# Patient Record
Sex: Male | Born: 1995 | Hispanic: No | Marital: Single | Smoking: Never smoker
Health system: Southern US, Community
[De-identification: ages and names within clinical notes are randomized; demographics above are authoritative.]

## PROBLEM LIST (undated history)

## (undated) DIAGNOSIS — E119 Type 2 diabetes mellitus without complications: Secondary | ICD-10-CM

## (undated) DIAGNOSIS — J309 Allergic rhinitis, unspecified: Secondary | ICD-10-CM

## (undated) HISTORY — DX: Type 2 diabetes mellitus without complications: E11.9

## (undated) HISTORY — PX: APPENDECTOMY: SHX54

## (undated) HISTORY — DX: Allergic rhinitis, unspecified: J30.9

---
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
## 2014-01-26 ENCOUNTER — Emergency Department (HOSPITAL_COMMUNITY)
Admission: EM | Admit: 2014-01-26 | Discharge: 2014-01-26 | Disposition: A | Discharge status: Home / Self Care | Payer: BC Managed Care – PPO | Attending: Emergency Medicine | Admitting: Emergency Medicine

## 2014-01-26 ENCOUNTER — Emergency Department (HOSPITAL_COMMUNITY): Payer: BC Managed Care – PPO

## 2014-01-26 DIAGNOSIS — M25571 Pain in right ankle and joints of right foot: Secondary | ICD-10-CM

## 2014-01-26 DIAGNOSIS — S8990XA Unspecified injury of unspecified lower leg, initial encounter: Secondary | ICD-10-CM | POA: Insufficient documentation

## 2014-01-26 DIAGNOSIS — Y9239 Other specified sports and athletic area as the place of occurrence of the external cause: Secondary | ICD-10-CM | POA: Insufficient documentation

## 2014-01-26 DIAGNOSIS — X500XXA Overexertion from strenuous movement or load, initial encounter: Secondary | ICD-10-CM | POA: Insufficient documentation

## 2014-01-26 DIAGNOSIS — S99929A Unspecified injury of unspecified foot, initial encounter: Principal | ICD-10-CM

## 2014-01-26 DIAGNOSIS — S99919A Unspecified injury of unspecified ankle, initial encounter: Principal | ICD-10-CM

## 2014-01-26 DIAGNOSIS — Y92838 Other recreation area as the place of occurrence of the external cause: Secondary | ICD-10-CM

## 2014-01-26 DIAGNOSIS — Y9367 Activity, basketball: Secondary | ICD-10-CM | POA: Insufficient documentation

## 2014-01-26 MED ORDER — IBUPROFEN 400 MG PO TABS
800.0000 mg | ORAL_TABLET | Freq: Once | ORAL | Status: AC
  Administered 2014-01-26: 800 mg via ORAL
  Filled 2014-01-26: qty 2

## 2014-01-26 MED ORDER — IBUPROFEN 800 MG PO TABS: 800.0000 mg | ORAL_TABLET | Freq: Three times a day (TID) | ORAL | Status: AC

## 2014-01-26 NOTE — ED Provider Notes (Signed)
CSN: [id number]     Arrival date & time 01/26/14  2029 History  This chart was scribed for non-physician practitioner, Coral CeoJessica Daisa Stennis, PA-C, working with Gavin PoundMichael Y. Oletta LamasGhim, MD by Smiley HousemanFallon Davis, ED Scribe. This patient was seen in room TR05C/TR05C and the patient's care was started at 10:18 PM.  Chief Complaint  Patient presents with  . Ankle Pain   The history is provided by the patient. No language interpreter was used.   HPI Comments: Ryan Waller is a 18 y.o. male who presents to the Emergency Department complaining of constant worsening right ankle pain that started about 1 hour ago. Pt states he twisted his ankle playing basketball. Pt states when he landed he rolled his foot on the lateral aspect. Pt denies pain radiating into his foot. Movement and ambulation makes his pain worse. No numbness, tingling, loss of sensation or weakness. Pt denies taking anything for pain PTA. Pt denies any other injuries. He states he is otherwise healthy. No previous ankle injuries in the past.    No past medical history on file. Past Surgical History  Procedure Laterality Date  . Laparoscopic appendectomy  11/18/2011    Procedure: APPENDECTOMY LAPAROSCOPIC;  Surgeon: Judie PetitM. Leonia CoronaShuaib Farooqui, MD;  Location: [facility] OR;  Service: Pediatrics;  Laterality: Bilateral;  Ruptured Appendix   Family History  Problem Relation Age of Onset  . Hypertension Maternal Grandfather   . Diabetes Maternal Grandfather   . Hypertension Paternal Grandfather    History  Substance Use Topics  . Smoking status: Never Smoker   . Smokeless tobacco: Not on file  . Alcohol Use: No    Review of Systems  Constitutional: Negative for fever and chills.  Gastrointestinal: Negative for nausea, vomiting, abdominal pain and diarrhea.  Musculoskeletal: Positive for arthralgias (Right ankle) and joint swelling. Negative for neck pain and neck stiffness.  Skin: Negative for color change and rash.  Neurological: Negative for weakness, numbness  and headaches.  Psychiatric/Behavioral: Negative for behavioral problems and confusion.  All other systems reviewed and are negative.   Allergies  Review of patient's allergies indicates no known allergies.  Home Medications   Prior to Admission medications   Not on File   Triage Vitals: BP 149/86  Pulse 116  Temp(Src) 97 F (36.1 C) (Oral)  Resp 20  Ht 5\' 10"  (1.778 m)  Wt 225 lb (102.059 kg)  BMI 32.28 kg/m2  SpO2 99%  Filed Vitals:   01/26/14 2033 01/26/14 2226  BP: 149/86 145/73  Pulse: 116 63  Temp: 97 F (36.1 C)   TempSrc: Oral   Resp: 20 16  Height: 5\' 10"  (1.778 m)   Weight: 225 lb (102.059 kg)   SpO2: 99% 98%    Physical Exam  Nursing note and vitals reviewed. Constitutional: He is oriented to person, place, and time. He appears well-developed and well-nourished. No distress.  HENT:  Head: Normocephalic and atraumatic.  Right Ear: External ear normal.  Left Ear: External ear normal.  Mouth/Throat: Oropharynx is clear and moist.  Eyes: Conjunctivae are normal. Right eye exhibits no discharge. Left eye exhibits no discharge.  Neck: Neck supple.  Cardiovascular: Normal rate.   Dorsalis pedis pulses present and equal bilaterally  Pulmonary/Chest: Effort normal.  Abdominal: Soft.  Musculoskeletal: Normal range of motion. He exhibits edema and tenderness.       Feet:  Tenderness to palpation to the right lateral ankle with associated mild edema. No ecchymosis, erythema or wounds. No tenderness to the right foot, calf, or  knee throughout. ROM intact in the right foot, ankle and knee.   Neurological: He is alert and oriented to person, place, and time.  Sensation intact in the right foot  Skin: Skin is warm and dry. He is not diaphoretic.     ED Course  Procedures (including critical care time) DIAGNOSTIC STUDIES: Oxygen Saturation is 99% on RA, normal by my interpretation.    COORDINATION OF CARE: 10:21 PM-Informed pt his x-ray showed normal  results.  Will order splint and crutches.  Will order ibuprofen.  Patient informed of current plan of treatment and evaluation and agrees with plan.    Imaging Review Dg Ankle Complete Right  01/26/2014   CLINICAL DATA:  Pain post trauma  EXAM: RIGHT ANKLE - COMPLETE 3+ VIEW  COMPARISON:  None.  FINDINGS: Frontal, oblique, and lateral views were obtained. There is soft tissue swelling laterally. No fracture or effusion. Ankle mortise appears intact.  IMPRESSION: Soft tissue swelling laterally.  No fracture.  Mortise intact.   Electronically Signed   By: Bretta BangWilliam  Woodruff M.D.   On: 01/26/2014 21:33   MDM   Ryan Waller is a 18 y.o. male who presents to the Emergency Department complaining of constant worsening right ankle pain that started about 1 hour ago after injury. Etiology of ankle pain possibly due to an ankle sprain. X-rays negative for fracture or malalignment. Patient neurovascularly intact. Patient given ankle splint and crutches. Given orthopedic referral. RICE method discussed. Return precautions, discharge instructions, and follow-up was discussed with the patient before discharge.     Discharge Medication List as of 01/26/2014 10:28 PM    START taking these medications   Details  ibuprofen (ADVIL,MOTRIN) 800 MG tablet Take 1 tablet (800 mg total) by mouth 3 (three) times daily., Starting 01/26/2014, Until Discontinued, Print        Final impressions: 1. Ankle pain, right      Greer EeJessica Katlin Laurine Kuyper PA-C           Jillyn LedgerJessica K Amarachukwu Lakatos, PA-C 01/28/14 [phone]

## 2014-01-26 NOTE — ED Notes (Signed)
Patient transported to X-ray 

## 2014-01-26 NOTE — Discharge Instructions (Signed)
Take Ibuprofen 600-800 mg every 6-8 hours for pain and swelling  Keep leg elevated  Use crutches and ankle splint  Use RICE method - see below  Return to the emergency department if you develop any changing/worsening condition or any other concerns (please read additional information regarding your condition below)    Ankle Pain Ankle pain is a common symptom. The bones, cartilage, tendons, and muscles of the ankle joint perform a lot of work each day. The ankle joint holds your body weight and allows you to move around. Ankle pain can occur on either side or back of 1 or both ankles. Ankle pain may be sharp and burning or dull and aching. There may be tenderness, stiffness, redness, or warmth around the ankle. The pain occurs more often when a person walks or puts pressure on the ankle. CAUSES  There are many reasons ankle pain can develop. It is important to work with your caregiver to identify the cause since many conditions can impact the bones, cartilage, muscles, and tendons. Causes for ankle pain include:  Injury, including a break (fracture), sprain, or strain often due to a fall, sports, or a high-impact activity.  Swelling (inflammation) of a tendon (tendonitis).  Achilles tendon rupture.  Ankle instability after repeated sprains and strains.  Poor foot alignment.  Pressure on a nerve (tarsal tunnel syndrome).  Arthritis in the ankle or the lining of the ankle.  Crystal formation in the ankle (gout or pseudogout). DIAGNOSIS  A diagnosis is based on your medical history, your symptoms, results of your physical exam, and results of diagnostic tests. Diagnostic tests may include X-ray exams or a computerized magnetic scan (magnetic resonance imaging, MRI). TREATMENT  Treatment will depend on the cause of your ankle pain and may include:  Keeping pressure off the ankle and limiting activities.  Using crutches or other walking support (a cane or brace).  Using rest, ice,  compression, and elevation.  Participating in physical therapy or home exercises.  Wearing shoe inserts or special shoes.  Losing weight.  Taking medications to reduce pain or swelling or receiving an injection.  Undergoing surgery. HOME CARE INSTRUCTIONS   Only take over-the-counter or prescription medicines for pain, discomfort, or fever as directed by your caregiver.  Put ice on the injured area.  Put ice in a plastic bag.  Place a towel between your skin and the bag.  Leave the ice on for 15-20 minutes at a time, 03-04 times a day.  Keep your leg raised (elevated) when possible to lessen swelling.  Avoid activities that cause ankle pain.  Follow specific exercises as directed by your caregiver.  Record how often you have ankle pain, the location of the pain, and what it feels like. This information may be helpful to you and your caregiver.  Ask your caregiver about returning to work or sports and whether you should drive.  Follow up with your caregiver for further examination, therapy, or testing as directed. SEEK MEDICAL CARE IF:   Pain or swelling continues or worsens beyond 1 week.  You have an oral temperature above 102 F (38.9 C).  You are feeling unwell or have chills.  You are having an increasingly difficult time with walking.  You have loss of sensation or other new symptoms.  You have questions or concerns. MAKE SURE YOU:   Understand these instructions.  Will watch your condition.  Will get help right away if you are not doing well or get worse. Document Released: 03/14/2010  Document Revised: 12/17/2011 Document Reviewed: 03/14/2010 [facility] Patient Information 2014 [place], [place].  RICE: Routine Care for Injuries The routine care of many injuries includes Rest, Ice, Compression, and Elevation (RICE). HOME CARE INSTRUCTIONS  Rest is needed to allow your body to heal. Routine activities can usually be resumed when comfortable. Injured  tendons and bones can take up to 6 weeks to heal. Tendons are the cord-like structures that attach muscle to bone.  Ice following an injury helps keep the swelling down and reduces pain.  Put ice in a plastic bag.  Place a towel between your skin and the bag.  Leave the ice on for 15-20 minutes, 03-04 times a day. Do this while awake, for the first 24 to 48 hours. After that, continue as directed by your caregiver.  Compression helps keep swelling down. It also gives support and helps with discomfort. If an elastic bandage has been applied, it should be removed and reapplied every 3 to 4 hours. It should not be applied tightly, but firmly enough to keep swelling down. Watch fingers or toes for swelling, bluish discoloration, coldness, numbness, or excessive pain. If any of these problems occur, remove the bandage and reapply loosely. Contact your caregiver if these problems continue.  Elevation helps reduce swelling and decreases pain. With extremities, such as the arms, hands, legs, and feet, the injured area should be placed near or above the level of the heart, if possible. SEEK IMMEDIATE MEDICAL CARE IF:  You have persistent pain and swelling.  You develop redness, numbness, or unexpected weakness.  Your symptoms are getting worse rather than improving after several days. These symptoms may indicate that further evaluation or further X-rays are needed. Sometimes, X-rays may not show a small broken bone (fracture) until 1 week or 10 days later. Make a follow-up appointment with your caregiver. Ask when your X-ray results will be ready. Make sure you get your X-ray results. Document Released: 01/06/2001 Document Revised: 12/17/2011 Document Reviewed: 02/23/2011 [facility] Patient Information 2014 [place]Care, [place]LL[place].   Emergency Department Resource Guide 1) Find a Doctor and Pay Out of Pocket Although you won't have to find out who is covered by your insurance plan, it is a good idea to ask  around and get recommendations. You will then need to call the office and see if the doctor you have chosen will accept you as a new patient and what types of options they offer for patients who are self-pay. Some doctors offer discounts or will set up payment plans for their patients who do not have insurance, but you will need to ask so you aren't surprised when you get to your appointment.  2) Contact Your Local Health Department Not all health departments have doctors that can see patients for sick visits, but many do, so it is worth a call to see if yours does. If you don't know where your local health department is, you can check in your phone book. The CDC also has a tool to help you locate your state's health department, and many state websites also have listings of all of their local health departments.  3) Find a Walk-in Clinic If your illness is not likely to be very severe or complicated, you may want to try a walk in clinic. These are popping up all over the country in pharmacies, drugstores, and shopping centers. They're usually staffed by nurse practitioners or physician assistants that have been trained to treat common illnesses and complaints. They're usually fairly quick and inexpensive.  However, if you have serious medical issues or chronic medical problems, these are probably not your best option.  No Primary Care Doctor: - Call Health Connect at  [phone] - they can help you locate a primary care doctor that  accepts your insurance, provides certain services, etc. - Physician Referral Service- [phone]  Chronic Pain Problems: Organization         Address  Phone   Notes  [facility]  [phone] Patients need to be referred by their primary care doctor.   Medication Assistance: Organization         Address  Phone   Notes  [facility] Medication [facility] [address] [place], [place] [address] [phone] --Must be a  resident of [place] [facility] -- Must have NO insurance coverage whatsoever (no Medicaid/ Medicare, etc.) -- The pt. MUST have a primary care doctor that directs their care regularly and follows them in the community   MedAssist  [phone]   [employer]  [phone]    Agencies that provide inexpensive medical care: Organization         Address  Phone   Notes  [facility]  [phone]   [facility]    [phone]   [facility] [address] [place], [place] [address] [phone]) [phone]   [facility] [address], [place] [phone]) [phone]   Planned Parenthood    [phone]   [facility]    [phone]) [phone]   [facility] and [facility]  [address], [place] Phone:  [phone], Fax:  [phone] Hours of Operation:  9 am - 6 pm, M-F.  Also accepts Medicaid/Medicare and self-pay.  [facility]  [address], [place] Phone: [phone], Fax: [phone]. Hours of Operation:  8:30 am - 5:30 pm, M-F.  Also accepts Medicaid and self-pay.  [facility] [place] [address], [place] Phone: [phone]   [facility] [address], [place], [place] [phone], Ext. [phone] Mondays & Thursdays: 7-9 AM.  First 15 patients are seen on a first come, first serve basis.    Medicaid-accepting [facility] Providers:  Organization         Address  Phone   Notes  [facility] [address], [place] [phone] Also accepts self-pay patients.  [facility] [address], [place]  [phone]   [facility] [address], [place] [phone]   [facility] [address] [place] [address], [place] [phone]) [phone]   Renaye RakersVeita Bland [address], [place]   [phone] Only accepts  [place] Access [place] patients after they have their name applied to their card.   Self-Pay (no insurance) in [facility] [place]:  Organization         Address  Phone   Notes  Sickle Cell Patients, [facility] [address], [place] [phone]   [facility] [address], [place] [phone]   [facility] [place]  [address], [place] [phone]) [phone]   [facility]/Dr. Osei-Bonsu  [address] [place] [address], [place] or [id number], [place] [phone] Phone number for both [place] and [place] locations is the same.  [facility] [address], [place] [phone]   [facility] [address], [place] or [address] [phone]   [facility] [address], [place] [phone], phone; [phone], fax Sees patients 1st and 3rd Saturday of every month.  Must not qualify for public or private insurance (i.e. Medicaid, Medicare, [place] Health Choice, Veterans' Benefits)  Household income should be no more than 200% of the poverty level The clinic cannot treat you if you are pregnant or think you are pregnant  Sexually transmitted diseases are not treated at the clinic.    Dental Care: Organization         Address  Phone  Notes  [facility] [address] [place], [place] [phone] Accepts children up to age 88 who are enrolled in [place] or [place] Health Choice; pregnant women with a Medicaid card; and children who have applied for Medicaid or [place] Health Choice, but were declined, whose parents can pay a reduced fee at time of service.  [facility] of [facility]  [address], [place] [phone] Accepts children up to age 61 who are enrolled in [place] or [place] Health Choice; pregnant women  with a Medicaid card; and children who have applied for Medicaid or [place] Health Choice, but were declined, whose parents can pay a reduced fee at time of service.  [facility]  [address] [place], [place] [phone] Patients are seen by appointment only. Walk-ins are not accepted. [facility] will see patients 87 years of age and older. Monday - Tuesday (8am-5pm) Most Wednesdays (8:30-5pm) $30 per visit, cash only  [facility] Adult Dental Access PROGRAM  [address], [facility] [phone] Patients are seen by appointment only. Walk-ins are not accepted. [facility] will see patients 34 years of age and older. One Wednesday Evening (Monthly: Volunteer Based).  $30 per visit, cash only  [employer]  [phone] for adults; Children under age 98, call Graduate Pediatric Dentistry at [phone]. Children aged 17-14, please call [phone] to request a pediatric application.  Dental services are provided in all areas of dental care including fillings, crowns and bridges, complete and partial dentures, implants, gum treatment, root canals, and extractions. Preventive care is also provided. Treatment is provided to both adults and children. Patients are selected via a lottery and there is often a waiting list.   [facility] [address] [place]  [phone] [email or web address]   [facility] [address] [place], [place] [phone], Ext. [phone] Second and Fourth Thursday of each month, opens at 6:30 AM; Clinic ends at 9 AM.  Patients are seen on a first-come first-served basis, and a limited number are seen during each clinic.   [facility]  [address] [place], [place] [phone]   Eligibility Requirements You must have lived in [place], [place], or [place] counties for at least the last three months.   You cannot be eligible for state or federal sponsored [employer], including [employer], [place], or [employer].   You generally cannot be eligible for healthcare insurance through your employer.    How to apply: Eligibility screenings are held every Tuesday and Wednesday afternoon from 1:00 pm until 4:00 pm. You  do not need an appointment for the interview!  [facility] [address] [place], [place] [phone]   [facility] Health Department  [phone]   [facility] Health Department  [phone]   [facility] Health Department  [phone]    Behavioral Health Resources in the Community: Intensive Outpatient Programs Organization         Address  Phone  Notes  [facility] [address], [place], [place] [phone]   [facility]patient [address], [place], [place] [phone]   [facility] [address] [place], [place]  [phone]   [facility] [address]  [place], [place] [phone] or [phone]   Substance Abuse Resources Organization         Address  Phone  Notes  Alcohol [facility] Drug Services  [phone]   [facility]  [phone]   [facility] [place]  [phone]   Floydene Flock  [phone]   Residential & Outpatient Substance Abuse Program  [phone]   Psychological Services Organization         Address  Phone  Notes  [facility]  [phone]   [facility] Services  [phone]   [facility] [address] [place] [phone] or [phone]    Mobile Crisis Teams Organization         Address  Phone  Notes  [facility], Mobile Crisis Care Unit  [phone]   [facility]  [address] [place], [place] [phone]   Doristine Locks [address] [place] [phone]    Self-Help/Support Groups Organization         Address  Phone             Notes  Mental  Health Assoc. of [place] - variety of support groups  [phone]- I7437963 Call for more information  Narcotics Anonymous (NA), [facility] [address], [employer] [place]  2 meetings at this location   Statistician         Address  Phone  Notes  [facility] [address],    [place]  [phone]   [facility]  [address] [place] [address], [place], [place] [phone]   [facility] [address] [place], [place] [phone] Admissions: 8am-3pm M-F  [facility] [address]    [place], [place] [phone]   [facility] [address] [place], [place], [place] [phone]   [facility] [address],  [place], [place] [phone]   [facility] - Intensive Outpatient [address], [place], [place] [phone]   [facility] (Addiction Recovery [facility] [address]  [place], [place] [phone] or [phone]   [facility] ([facility]) [address], [place], [place] [phone] Accepts Medicaid  [facility] [address],  [place] [phone] Substance Abuse/Addiction Treatment   [facility] Organization         Address  Phone  Notes  [facility]  [phone]   Angie Fava, PhD [address] [place], [place]   [phone] or [phone]   [facility] Behavioral   [address] [place], [place] [phone]   [facility] [address] [place], [place] [phone] Insurance/Medicaid/sponsorship through [employer] and Families [address]  [place], [place] [phone] [place], [place] [phone]    Dr. Adele Schilder  [phone]   [facility] Dept. 1) [address]  [place] 2) [place] 3)  [address], [place] [phone]  [phone]   [place] [phone] or [phone] (After Hours)

## 2014-01-26 NOTE — ED Notes (Signed)
Pt c/o right ankle pain. Pt states he twisted it 45 minutes ago. Pt A&Ox4, respirations equal and unlabored, skin warm and dry

## 2014-01-26 NOTE — ED Notes (Signed)
Onset 7:30pm pt twisted right ankle playing basketball.  Unable to bear weight on ankle.

## 2014-01-26 NOTE — ED Notes (Signed)
Pt declines ice pack at this time.

## 2014-01-30 NOTE — ED Provider Notes (Signed)
Medical screening examination/treatment/procedure(s) were performed by non-physician practitioner and as supervising physician I was immediately available for consultation/collaboration.  Ryan PoundMichael Y. Zaide Kardell, MD 01/30/14 1417

## 2015-11-17 ENCOUNTER — Emergency Department (HOSPITAL_BASED_OUTPATIENT_CLINIC_OR_DEPARTMENT_OTHER): Payer: Medicaid Other

## 2015-11-17 ENCOUNTER — Emergency Department (HOSPITAL_BASED_OUTPATIENT_CLINIC_OR_DEPARTMENT_OTHER)
Admission: EM | Admit: 2015-11-17 | Discharge: 2015-11-17 | Disposition: A | Discharge status: Home / Self Care | Payer: Medicaid Other | Attending: Emergency Medicine | Admitting: Emergency Medicine

## 2015-11-17 ENCOUNTER — Encounter (HOSPITAL_BASED_OUTPATIENT_CLINIC_OR_DEPARTMENT_OTHER): Payer: Self-pay | Admitting: *Deleted

## 2015-11-17 DIAGNOSIS — Z23 Encounter for immunization: Secondary | ICD-10-CM | POA: Diagnosis not present

## 2015-11-17 DIAGNOSIS — Z792 Long term (current) use of antibiotics: Secondary | ICD-10-CM | POA: Insufficient documentation

## 2015-11-17 DIAGNOSIS — Y9289 Other specified places as the place of occurrence of the external cause: Secondary | ICD-10-CM | POA: Insufficient documentation

## 2015-11-17 DIAGNOSIS — Y9389 Activity, other specified: Secondary | ICD-10-CM | POA: Insufficient documentation

## 2015-11-17 DIAGNOSIS — W25XXXA Contact with sharp glass, initial encounter: Secondary | ICD-10-CM | POA: Diagnosis not present

## 2015-11-17 DIAGNOSIS — Z791 Long term (current) use of non-steroidal anti-inflammatories (NSAID): Secondary | ICD-10-CM | POA: Diagnosis not present

## 2015-11-17 DIAGNOSIS — Y998 Other external cause status: Secondary | ICD-10-CM | POA: Insufficient documentation

## 2015-11-17 DIAGNOSIS — IMO0002 Reserved for concepts with insufficient information to code with codable children: Secondary | ICD-10-CM

## 2015-11-17 DIAGNOSIS — S61210A Laceration without foreign body of right index finger without damage to nail, initial encounter: Secondary | ICD-10-CM | POA: Insufficient documentation

## 2015-11-17 MED ORDER — MUPIROCIN 2 % EX OINT: TOPICAL_OINTMENT | CUTANEOUS | Status: DC

## 2015-11-17 MED ORDER — TETANUS-DIPHTH-ACELL PERTUSSIS 5-2.5-18.5 LF-MCG/0.5 IM SUSP
0.5000 mL | Freq: Once | INTRAMUSCULAR | Status: AC
  Administered 2015-11-17: 0.5 mL via INTRAMUSCULAR
  Filled 2015-11-17: qty 0.5

## 2015-11-17 NOTE — ED Notes (Signed)
Yesterday he cut his right index finger on a broken bottle.

## 2015-11-17 NOTE — ED Notes (Signed)
Pt verbalizes understanding of d/c instructions and denies any further needs at this time. 

## 2015-11-17 NOTE — Discharge Instructions (Signed)
Apply antibiotic ointment to affected area twice daily. Keep area clean and dry.  Please return to the ER for new or worsening symptoms, any additional concerns.

## 2015-11-18 NOTE — ED Provider Notes (Signed)
CSN: [id number]     Arrival date & time 11/17/15  2152 History   First MD Initiated Contact with Patient 11/17/15 2211     Chief Complaint  Patient presents with  . Laceration     (Consider location/radiation/quality/duration/timing/severity/associated sxs/prior Treatment) Patient is a 20 y.o. male presenting with skin laceration. The history is provided by medical records. No language interpreter was used.  Laceration  Ryan Waller presents for cut on right index finger that occurred yesterday while he was lifting a piece of glass. Patient states no glass was broken off of main piece and does not believe fragments are inside wound. No medications taken PTA. Denies erythema, numbness/tingling of digit. No radiation of pain. Last tetanus unknown.   History reviewed. No pertinent past medical history. Past Surgical History  Procedure Laterality Date  . Laparoscopic appendectomy  11/18/2011    Procedure: APPENDECTOMY LAPAROSCOPIC;  Surgeon: Judie Petit. Leonia Corona, MD;  Location: [facility] OR;  Service: Pediatrics;  Laterality: Bilateral;  Ruptured Appendix  . Appendectomy     Family History  Problem Relation Age of Onset  . Hypertension Maternal Grandfather   . Diabetes Maternal Grandfather   . Hypertension Paternal Grandfather    Social History  Substance Use Topics  . Smoking status: Never Smoker   . Smokeless tobacco: None  . Alcohol Use: No    Review of Systems  Constitutional: Negative for fever and chills.  HENT: Negative for congestion and sore throat.   Eyes: Negative for visual disturbance.  Respiratory: Negative for cough and shortness of breath.   Cardiovascular: Negative.   Gastrointestinal: Negative for nausea, vomiting and abdominal pain.  Genitourinary: Negative for dysuria.  Musculoskeletal: Negative for back pain and neck pain.  Skin: Positive for wound.  Neurological: Negative for dizziness, weakness and headaches.      Allergies  Review of patient's allergies  indicates no known allergies.  Home Medications   Prior to Admission medications   Medication Sig Start Date End Date Taking? Authorizing Provider  ibuprofen (ADVIL,MOTRIN) 800 MG tablet Take 1 tablet (800 mg total) by mouth 3 (three) times daily. 01/26/14   Jillyn Ledger, PA-C  mupirocin ointment (BACTROBAN) 2 % Apply to affected area twice daily. 11/17/15   Caylynn Minchew Pilcher Evin Chirco, PA-C   BP 131/83 mmHg  Pulse 63  Temp(Src) 97.8 F (36.6 C) (Oral)  Resp 18  Ht  (1.803 m)  Wt 99.791 kg  BMI 30.70 kg/m2  SpO2 98% Physical Exam  Constitutional: He is oriented to person, place, and time. He appears well-developed and well-nourished.  Alert and in no acute distress  HENT:  Head: Normocephalic and atraumatic.  Cardiovascular: Normal rate, regular rhythm, normal heart sounds and intact distal pulses.  Exam reveals no gallop and no friction rub.   No murmur heard. Pulmonary/Chest: Effort normal and breath sounds normal. No respiratory distress. He has no wheezes. He has no rales. He exhibits no tenderness.  Abdominal: Soft. Bowel sounds are normal. He exhibits no distension and no mass. There is no tenderness. There is no rebound and no guarding.  Musculoskeletal: He exhibits no edema.  No gross deformity noted. Patient has full active and passive range of motion without pain. The patient has normal sensation and motor function in the median, ulnar, and radial nerve distributions. 2+ Radial pulse. 5/5 muscle strength.   Neurological: He is alert and oriented to person, place, and time.  Skin: Skin is warm and dry.  1 cm laceration to distal right index  finger. No surrounding erythema.   Psychiatric: He has a normal mood and affect. His behavior is normal. Judgment and thought content normal.  Nursing note and vitals reviewed.   ED Course  Procedures (including critical care time) Labs Review Labs Reviewed - No data to display  Imaging Review Dg Finger Index Right  11/17/2015   CLINICAL DATA:  Laceration with continued bleeding.  Pain. EXAM: RIGHT INDEX FINGER 2+V COMPARISON:  None. FINDINGS: There is no evidence of fracture or dislocation. There is no evidence of arthropathy or other focal bone abnormality. Soft tissues are unremarkable. IMPRESSION: Negative. Electronically Signed   By: Elsie Stain M.D.   On: 11/17/2015 22:15   I have personally reviewed and evaluated these images and lab results as part of my medical decision-making.   EKG Interpretation None      MDM   Final diagnoses:  Laceration   Ryan Waller presents for small laceration of right index finger not requiring suturing. X-ray negative. Wound was thoroughly cleansed and explored in ED. Tetanus updated. Mupirocin ointment given. Home care instructions, follow up instructions given - all questions answered.   [facility] Detravion Tester, PA-C 11/18/15 0118  Paula Libra, MD 11/18/15 [phone]

## 2016-12-23 ENCOUNTER — Encounter (HOSPITAL_BASED_OUTPATIENT_CLINIC_OR_DEPARTMENT_OTHER): Payer: Self-pay

## 2016-12-23 ENCOUNTER — Emergency Department (HOSPITAL_BASED_OUTPATIENT_CLINIC_OR_DEPARTMENT_OTHER)
Admission: EM | Admit: 2016-12-23 | Discharge: 2016-12-23 | Disposition: A | Discharge status: Home / Self Care | Payer: BLUE CROSS/BLUE SHIELD | Attending: Emergency Medicine | Admitting: Emergency Medicine

## 2016-12-23 DIAGNOSIS — S51851A Open bite of right forearm, initial encounter: Secondary | ICD-10-CM | POA: Diagnosis present

## 2016-12-23 DIAGNOSIS — S51811A Laceration without foreign body of right forearm, initial encounter: Secondary | ICD-10-CM | POA: Insufficient documentation

## 2016-12-23 DIAGNOSIS — Y939 Activity, unspecified: Secondary | ICD-10-CM | POA: Insufficient documentation

## 2016-12-23 DIAGNOSIS — Y929 Unspecified place or not applicable: Secondary | ICD-10-CM | POA: Insufficient documentation

## 2016-12-23 DIAGNOSIS — W540XXA Bitten by dog, initial encounter: Secondary | ICD-10-CM | POA: Insufficient documentation

## 2016-12-23 DIAGNOSIS — Y999 Unspecified external cause status: Secondary | ICD-10-CM | POA: Insufficient documentation

## 2016-12-23 MED ORDER — AMOXICILLIN-POT CLAVULANATE 875-125 MG PO TABS: 1.0000 | ORAL_TABLET | Freq: Two times a day (BID) | ORAL | 0 refills | Status: DC

## 2016-12-23 NOTE — ED Triage Notes (Signed)
Pt reports dog bite to right forearm. No bleeding. Tetanus within the last 5 years.

## 2016-12-23 NOTE — ED Notes (Signed)
Pt given d/c instructions as per chart. Rx x 1. Verbalizes understanding. No questions. 

## 2016-12-23 NOTE — ED Provider Notes (Signed)
[facility] Provider Note   CSN: [id number] Arrival date & time: 12/23/16  1652   By signing my name below, I, Ryan Waller, attest that this documentation has been prepared under the direction and in the presence of Buel ReamAlexandra Ethel Veronica, PA-C Electronically Signed: Soijett Waller, ED Scribe. 12/23/16. 5:50 PM.  History   Chief Complaint Chief Complaint  Patient presents with  . Animal Bite    HPI Ryan Waller is a 21 y.o. male who presents to the Emergency Department complaining of an animal bite onset 3 hours ago. Pt notes that he was bit to his right forearm by a friends dog that is UTD with its rabies vaccination. Pt reports associated right forearm pain to palpation only and bruising to affected area. Pt has tried wound care with alcohol and neosporin with relief of his symptoms. Per pt chart review, his last tetanus vaccination was 11/27/2015. Denies right elbow pain, right wrist pain, and any other symptoms.    The history is provided by the patient. No language interpreter was used.    History reviewed. No pertinent past medical history.  Patient Active Problem List   Diagnosis Date Noted  . Appendicitis with perforation 11/21/2011    Past Surgical History:  Procedure Laterality Date  . APPENDECTOMY    . LAPAROSCOPIC APPENDECTOMY  11/18/2011   Procedure: APPENDECTOMY LAPAROSCOPIC;  Surgeon: Judie PetitM. Leonia CoronaShuaib Farooqui, MD;  Location: [facility] OR;  Service: Pediatrics;  Laterality: Bilateral;  Ruptured Appendix       Home Medications    Prior to Admission medications   Medication Sig Start Date End Date Taking? Authorizing Provider  amoxicillin-clavulanate (AUGMENTIN) 875-125 MG tablet Take 1 tablet by mouth every 12 (twelve) hours. 12/23/16   Emi HolesAlexandra M Allisa Einspahr, PA-C  ibuprofen (ADVIL,MOTRIN) 800 MG tablet Take 1 tablet (800 mg total) by mouth 3 (three) times daily. 01/26/14   Jillyn LedgerJessica K Palmer, PA-C  mupirocin ointment (BACTROBAN) 2 % Apply to affected area twice daily. 11/17/15    Chase PicketJaime Pilcher Ward, PA-C    Family History Family History  Problem Relation Age of Onset  . Hypertension Maternal Grandfather   . Diabetes Maternal Grandfather   . Hypertension Paternal Grandfather     Social History Social History  Substance Use Topics  . Smoking status: Never Smoker  . Smokeless tobacco: Never Used  . Alcohol use No     Allergies   Patient has no known allergies.   Review of Systems Review of Systems  Musculoskeletal: Positive for myalgias (right forearm). Negative for arthralgias.  Skin: Positive for color change (bruising localized to bite site to right forearm) and wound (animal bite to right forearm).     Physical Exam Updated Vital Signs BP 125/83 (BP Location: Left Arm)   Pulse 85   Temp 98 F (36.7 C) (Oral)   Resp 16   Ht 5\' 11"  (1.803 m)   Wt 111.1 kg   SpO2 98%   BMI 34.17 kg/m   Physical Exam  Constitutional: He appears well-developed and well-nourished. No distress.  HENT:  Head: Normocephalic and atraumatic.  Mouth/Throat: Oropharynx is clear and moist. No oropharyngeal exudate.  Eyes: Conjunctivae are normal. Pupils are equal, round, and reactive to light. Right eye exhibits no discharge. Left eye exhibits no discharge. No scleral icterus.  Neck: Normal range of motion. Neck supple. No thyromegaly present.  Cardiovascular: Normal rate, regular rhythm, normal heart sounds and intact distal pulses.  Exam reveals no gallop and no friction rub.   No murmur  heard. Pulmonary/Chest: Effort normal and breath sounds normal. No stridor. No respiratory distress. He has no wheezes. He has no rales.  Abdominal: Soft. Bowel sounds are normal. He exhibits no distension. There is no tenderness. There is no rebound and no guarding.  Musculoskeletal: He exhibits no edema.  FROM with flexion, extension, abduction, adduction of right hand. Nl sensation. Cap refill less than 2 seconds. Radial pulses intact.  Lymphadenopathy:    He has no cervical  adenopathy.  Neurological: He is alert. Coordination normal.  Skin: Skin is warm and dry. Ecchymosis and laceration noted. No rash noted. He is not diaphoretic. No pallor.  Right forearm: Mild circular superficial laceration with underlying ecchymosis. No active bleeding.   Psychiatric: He has a normal mood and affect.  Nursing note and vitals reviewed.    ED Treatments / Results  DIAGNOSTIC STUDIES: Oxygen Saturation is 98% on RA, nl by my interpretation.    COORDINATION OF CARE: 5:48 PM Discussed treatment plan with pt at bedside which includes wound care, augmentin Rx, and pt agreed to plan.   Procedures Procedures (including critical care time)  Medications Ordered in ED Medications - No data to display   Initial Impression / Assessment and Plan / ED Course  I have reviewed the triage vital signs and the nursing notes.   Patient with mild, superficial dog bite to right forearm. Cleaned with iodine and saline in the ED. Neurovascularly intact. We will treat prophylactically with Augmentin. Wound care discussed. Follow-up to PCP or return to the emergency department if patient develops any increasing pain, redness, swelling, drainage, streaking from the area. Patient understands and agrees with plan. Patient vitals stable throughout ED course and discharged in satisfactory condition.  Final Clinical Impressions(s) / ED Diagnoses   Final diagnoses:  Dog bite, initial encounter    New Prescriptions Discharge Medication List as of 12/23/2016  5:52 PM    START taking these medications   Details  amoxicillin-clavulanate (AUGMENTIN) 875-125 MG tablet Take 1 tablet by mouth every 12 (twelve) hours., Starting Sun 12/23/2016, Print       I personally performed the services described in this documentation, which was scribed in my presence. The recorded information has been reviewed and is accurate.     Emi Holes, PA-C 12/23/16 1821    Melene Plan, DO 12/23/16 2323

## 2016-12-23 NOTE — Discharge Instructions (Signed)
Please see your primary care provider or return to emergency department if you develop any fevers, increasing pain, redness, swelling, drainage, red streaking from the wound. Wash the wounds daily with warm soapy water, apply antibiotic ointment, and clean dressing. Take Augmentin twice daily for 1 week. Make sure to finish off this medication.

## 2017-08-22 ENCOUNTER — Other Ambulatory Visit: Payer: Self-pay | Admitting: Cardiovascular Disease

## 2017-08-22 ENCOUNTER — Ambulatory Visit
Admission: RE | Admit: 2017-08-22 | Discharge: 2017-08-22 | Disposition: A | Discharge status: Home / Self Care | Payer: BLUE CROSS/BLUE SHIELD | Source: Ambulatory Visit | Attending: Cardiovascular Disease | Admitting: Cardiovascular Disease

## 2017-08-22 DIAGNOSIS — M545 Low back pain: Secondary | ICD-10-CM

## 2018-03-06 DIAGNOSIS — E669 Obesity, unspecified: Secondary | ICD-10-CM | POA: Insufficient documentation

## 2018-03-06 DIAGNOSIS — J302 Other seasonal allergic rhinitis: Secondary | ICD-10-CM | POA: Insufficient documentation

## 2019-09-17 IMAGING — CR DG LUMBAR SPINE COMPLETE 4+V
5 series · 5 of 5 positions shown · non-contrast
Comparison: None.

CLINICAL DATA: Low back pain with burning and feet

EXAM:
LUMBAR SPINE - COMPLETE 4+ VIEW

[t l-spine a.p.]
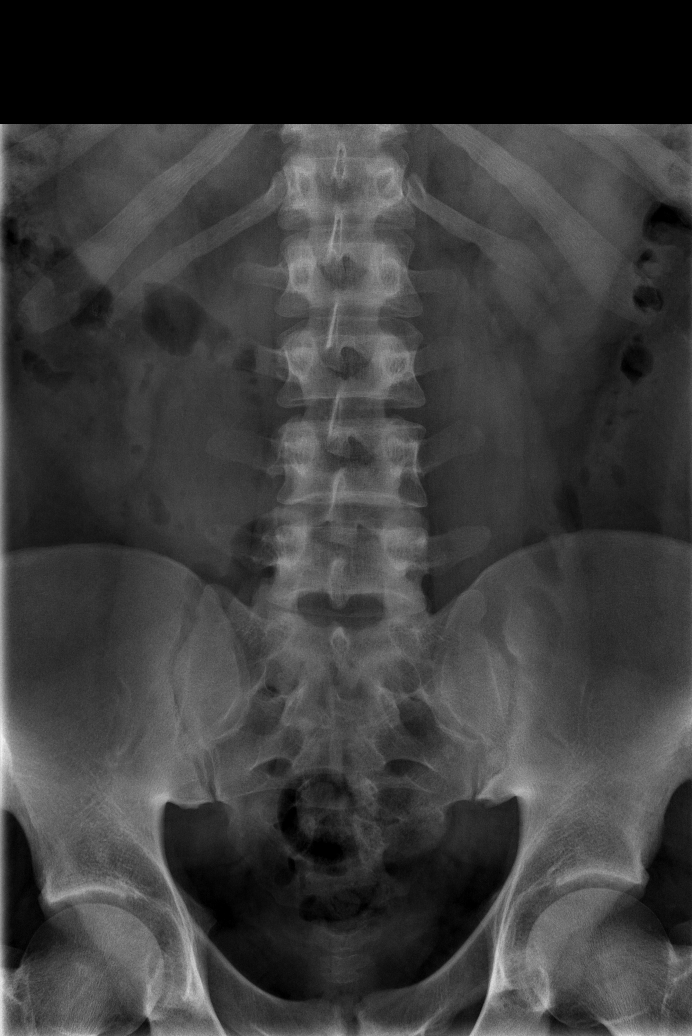

[t l-spine oblique exposure (1 of 2)]
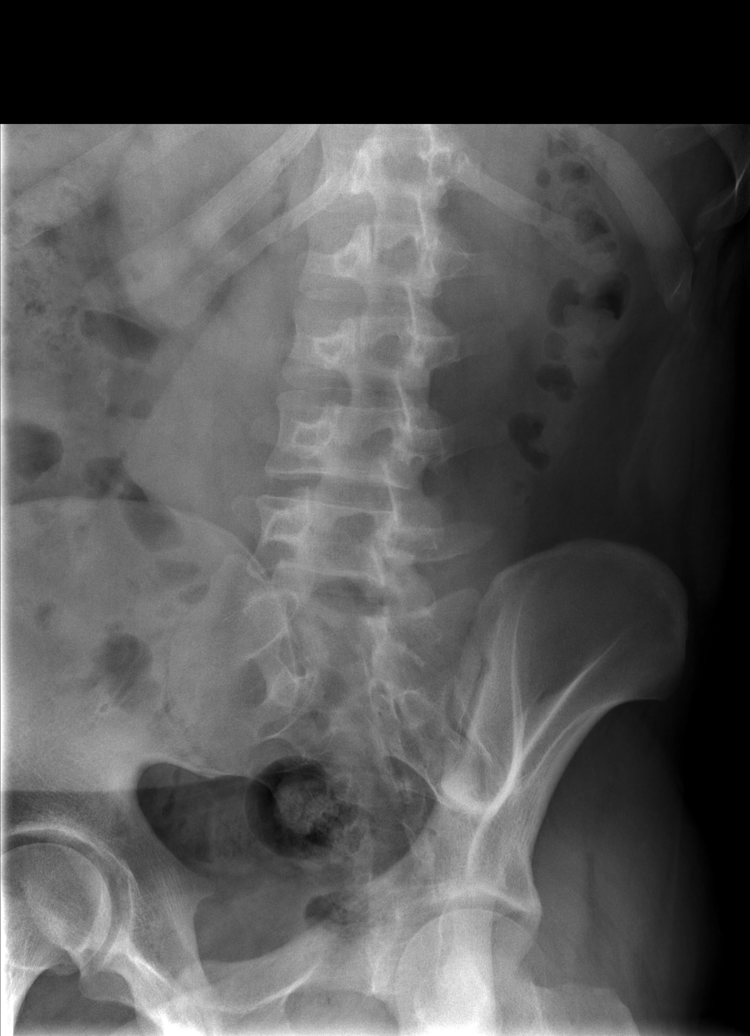

[t l-spine oblique exposure (2 of 2)]
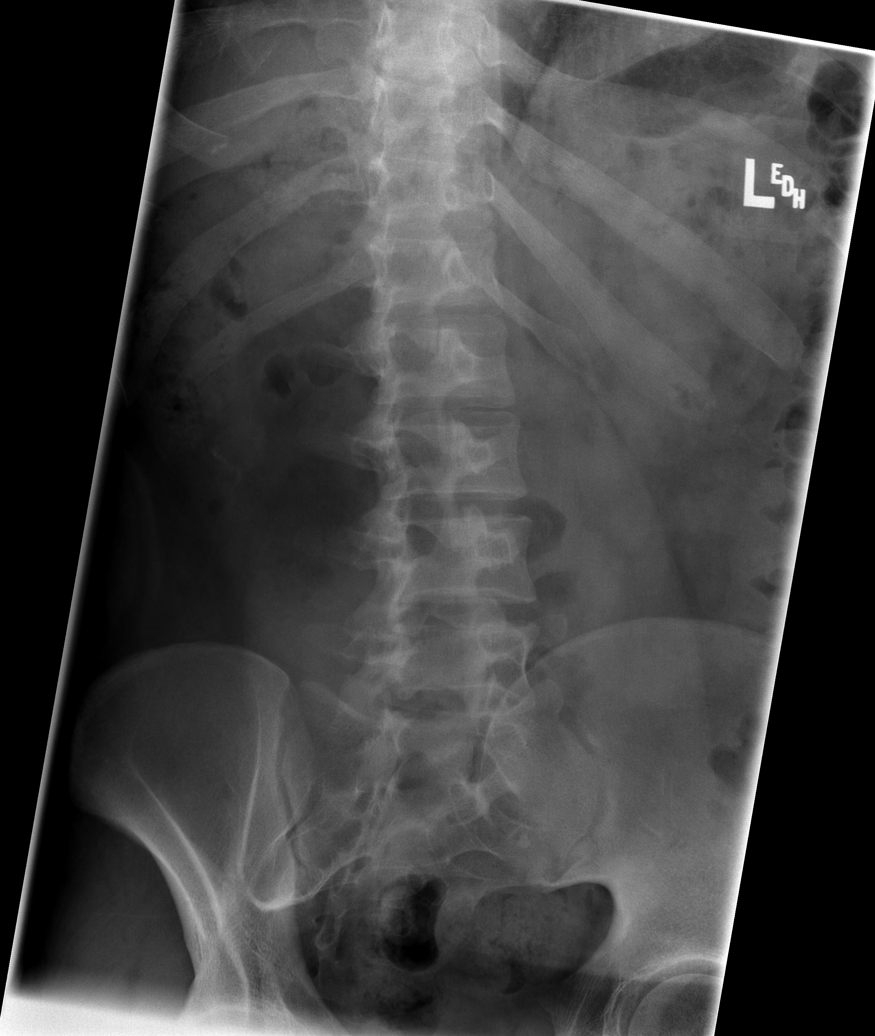

[t l-spine lat]
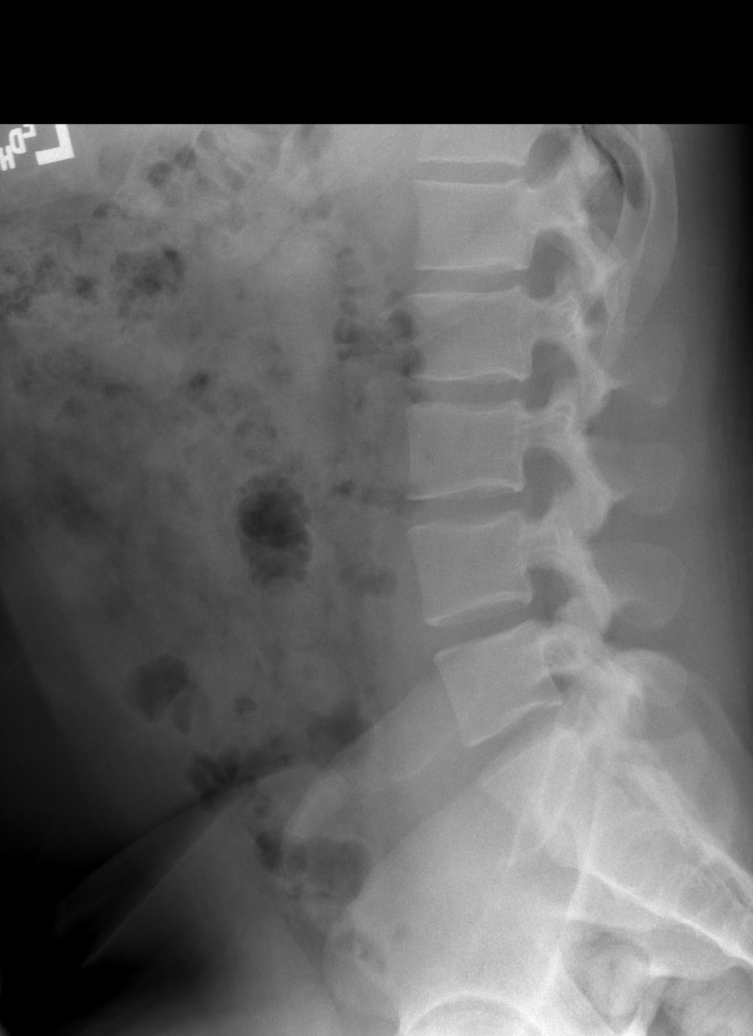

[t l-spine l5-s1 spot]
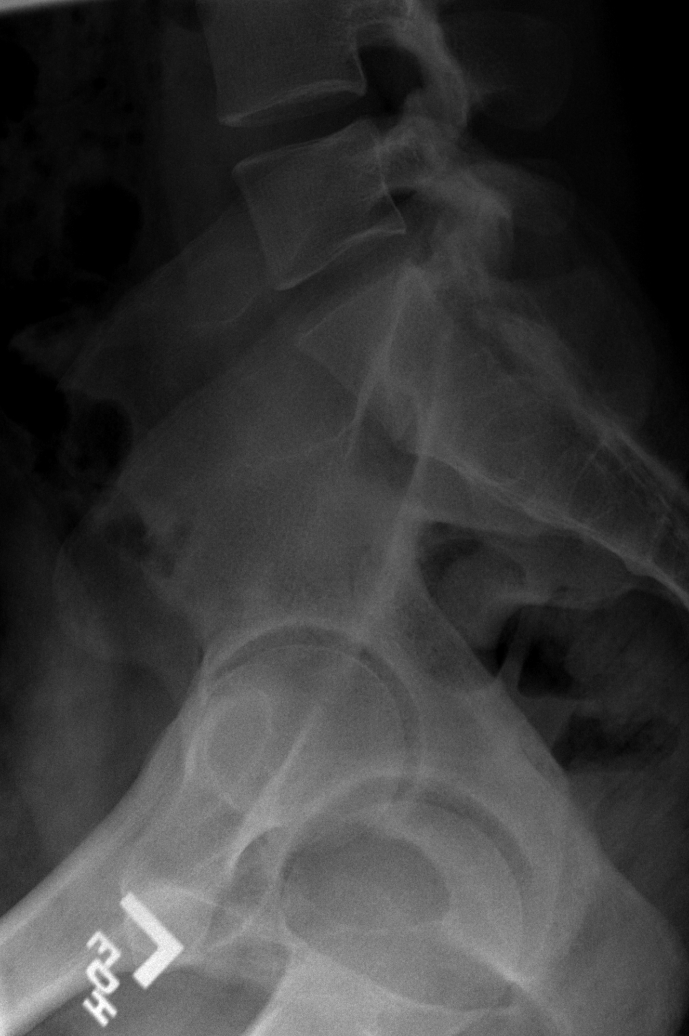

[5 of 5 positions shown; findings below may reference images not displayed]

FINDINGS: There is no evidence of lumbar spine fracture. Alignment is normal.
Intervertebral disc spaces are maintained. L5 is sacralized.
IMPRESSION: Negative.

## 2020-02-03 ENCOUNTER — Other Ambulatory Visit: Payer: Self-pay

## 2020-02-03 ENCOUNTER — Ambulatory Visit: Payer: Commercial Managed Care - PPO | Admitting: Allergy and Immunology

## 2020-02-03 ENCOUNTER — Encounter: Payer: Self-pay | Admitting: Allergy and Immunology

## 2020-02-03 VITALS — BP 116/72 | HR 68 | Temp 98.4°F | Resp 16 | Ht 69.3 in | Wt 266.0 lb

## 2020-02-03 DIAGNOSIS — J302 Other seasonal allergic rhinitis: Secondary | ICD-10-CM | POA: Diagnosis not present

## 2020-02-03 DIAGNOSIS — H1013 Acute atopic conjunctivitis, bilateral: Secondary | ICD-10-CM | POA: Diagnosis not present

## 2020-02-03 DIAGNOSIS — H6980 Other specified disorders of Eustachian tube, unspecified ear: Secondary | ICD-10-CM | POA: Insufficient documentation

## 2020-02-03 DIAGNOSIS — H6983 Other specified disorders of Eustachian tube, bilateral: Secondary | ICD-10-CM | POA: Diagnosis not present

## 2020-02-03 DIAGNOSIS — H6990 Unspecified Eustachian tube disorder, unspecified ear: Secondary | ICD-10-CM | POA: Insufficient documentation

## 2020-02-03 DIAGNOSIS — H1010 Acute atopic conjunctivitis, unspecified eye: Secondary | ICD-10-CM | POA: Insufficient documentation

## 2020-02-03 MED ORDER — OLOPATADINE HCL 0.2 % OP SOLN: OPHTHALMIC | 5 refills | Status: AC

## 2020-02-03 MED ORDER — XHANCE 93 MCG/ACT NA EXHU
2.0000 | INHALANT_SUSPENSION | Freq: Two times a day (BID) | NASAL | 5 refills | Status: AC | PRN
Start: 2020-02-03 — End: ?

## 2020-02-03 MED ORDER — LEVOCETIRIZINE DIHYDROCHLORIDE 5 MG PO TABS: 5.0000 mg | ORAL_TABLET | Freq: Every day | ORAL | 5 refills | Status: AC | PRN

## 2020-02-03 NOTE — Assessment & Plan Note (Addendum)
   Aeroallergen avoidance measures have been discussed and provided in written form.  A prescription has been provided for levocetirizine(Xyzal), 5 mg daily as needed.  A prescription has been provided for [facility], 2 actuations per nostril twice a day. Proper technique has been discussed and demonstrated.  Nasal saline lavage (NeilMed) has been recommended as needed and prior to medicated nasal sprays along with instructions for proper administration.  If allergen avoidance measures and medications fail to adequately relieve symptoms, subcutaneous aeroallergen immunotherapy or sublingual immunotherapy (Grastek) will be considered.

## 2020-02-03 NOTE — Assessment & Plan Note (Signed)
   Treatment plan as outlined above for allergic rhinitis.  A prescription has been provided for generic Pataday, one drop per eye daily as needed.  I have also recommended eye lubricant drops (i.e., Natural Tears) as needed. 

## 2020-02-03 NOTE — Progress Notes (Signed)
New Patient Note  RE: [name] MRN: [id number] DOB: Sep 07, 1996 Date of Office Visit: 02/03/2020  Referring provider: No ref. provider found Primary care provider: System, Pcp Not In  Chief Complaint: Nasal Congestion (runny nose sneezing, PND, ears and throat itchy.  Eyes watery and red.) and Allergic Rhinitis  (tried all OTC antihistamines without relief, Flonase does not help.)   History of present illness: Ryan Waller is a 24 y.o. male presenting today for evaluation of rhinoconjunctivitis.  He reports that over the past 2 years he has experienced progressively increasing nasal congestion, rhinorrhea, sneezing, postnasal drainage "all day", nasal pruritus, ocular pruritus, ear pressure, and sinus pressure.  These symptoms occur year-round but are most frequent and severe during the spring, summer, and fall.  He has attempted to control the symptoms with over-the-counter antihistamines and fluticasone nasal spray without adequate symptom relief.  He reports that the over-the-counter medication "does absolutely nothing."  Therefore, he does his best to stay indoors during the pollen seasons.  Assessment and plan: Seasonal allergic rhinitis  Aeroallergen avoidance measures have been discussed and provided in written form.  A prescription has been provided for levocetirizine(Xyzal), 5 mg daily as needed.  A prescription has been provided for [facility], 2 actuations per nostril twice a day. Proper technique has been discussed and demonstrated.  Nasal saline lavage (NeilMed) has been recommended as needed and prior to medicated nasal sprays along with instructions for proper administration.  If allergen avoidance measures and medications fail to adequately relieve symptoms, subcutaneous aeroallergen immunotherapy or sublingual immunotherapy (Grastek) will be considered.  Allergic conjunctivitis  Treatment plan as outlined above for allergic rhinitis.  A prescription has been  provided for generic Pataday, one drop per eye daily as needed.  I have also recommended eye lubricant drops (i.e., Natural Tears) as needed.  Eustachian tube dysfunction  Treatment plan as outlined above for allergic rhinitis.  For thick post nasal drainage, ear pressure, nasal congestion, and/or sinus pressure, add guaifenesin 1200 mg (Mucinex Maximum Strength) plus/minus pseudoephedrine 120 mg  twice daily as needed with adequate hydration as discussed. Pseudoephedrine is only to be used for short-term relief of nasal/sinus congestion. Long-term use is discouraged due to potential side effects.  If this problem persists or progresses, further evaluation by otolaryngology may be warranted.   Meds ordered this encounter  Medications  . levocetirizine (XYZAL) 5 MG tablet    Sig: Take 1 tablet (5 mg total) by mouth daily as needed for allergies.    Dispense:  30 tablet    Refill:  5  . Fluticasone Propionate (XHANCE) 93 MCG/ACT EXHU    Sig: Place 2 sprays into the nose 2 (two) times daily as needed.    Dispense:  32 mL    Refill:  5  . Olopatadine HCl (PATADAY) 0.2 % SOLN    Sig: One drop each eye once a day as needed for itchy eyes.    Dispense:  2.5 mL    Refill:  5    Diagnostics: Epicutaneous testing: Robust reactivity to [place] grass, [place] grass, [place] grass, [place], [place] fescue grass, perennial rye grass, sweet vernal grass, and Timothy grass. Intradermal testing: Negative.   Physical examination: Blood pressure 116/72, pulse 68, temperature 98.4 F (36.9 C), temperature source Oral, resp. rate 16, height 5' 9.3" (1.76 m), weight 266 lb (120.7 kg), SpO2 97 %.  General: Alert, interactive, in no acute distress. HEENT: TMs pearly gray, turbinates edematous and pale with clear discharge, post-pharynx  moderately erythematous. Neck: Supple without lymphadenopathy. Lungs: Clear to auscultation without wheezing, rhonchi or rales. CV: Normal S1, S2 without  murmurs. Abdomen: Nondistended, nontender. Skin: Warm and dry, without lesions or rashes. Extremities:  No clubbing, cyanosis or edema. Neuro:   Grossly intact.  Review of systems:  Review of systems negative except as noted in HPI / PMHx or noted below: Review of Systems  Constitutional: Negative.   HENT: Negative.   Eyes: Negative.   Respiratory: Negative.   Cardiovascular: Negative.   Gastrointestinal: Negative.   Genitourinary: Negative.   Musculoskeletal: Negative.   Skin: Negative.   Neurological: Negative.   Endo/Heme/Allergies: Negative.   Psychiatric/Behavioral: Negative.     Past medical history:  Past Medical History:  Diagnosis Date  . Allergic rhinitis     Past surgical history:  Past Surgical History:  Procedure Laterality Date  . APPENDECTOMY    . LAPAROSCOPIC APPENDECTOMY  11/18/2011   Procedure: APPENDECTOMY LAPAROSCOPIC;  Surgeon: Judie Petit. Leonia Corona, MD;  Location: [facility] OR;  Service: Pediatrics;  Laterality: Bilateral;  Ruptured Appendix    Family history: Family History  Problem Relation Age of Onset  . Hypertension Maternal Grandfather   . Diabetes Maternal Grandfather   . Hypertension Paternal Grandfather   . Allergic rhinitis Neg Hx   . Angioedema Neg Hx   . Asthma Neg Hx   . Eczema Neg Hx   . Immunodeficiency Neg Hx   . Urticaria Neg Hx     Social history: Social History   Socioeconomic History  . Marital status: Single    Spouse name: Not on file  . Number of children: Not on file  . Years of education: Not on file  . Highest education level: Not on file  Occupational History  . Not on file  Tobacco Use  . Smoking status: Never Smoker  . Smokeless tobacco: Never Used  Substance and Sexual Activity  . Alcohol use: No  . Drug use: Never  . Sexual activity: Yes  Other Topics Concern  . Not on file  Social History Narrative  . Not on file   Social Determinants of Health   Financial Resource Strain:   . Difficulty of Paying  Living Expenses:   Food Insecurity:   . Worried About Programme researcher, broadcasting/film/video in the Last Year:   . Barista in the Last Year:   Transportation Needs:   . Freight forwarder (Medical):   Marland Kitchen Lack of Transportation (Non-Medical):   Physical Activity:   . Days of Exercise per Week:   . Minutes of Exercise per Session:   Stress:   . Feeling of Stress :   Social Connections:   . Frequency of Communication with Friends and Family:   . Frequency of Social Gatherings with Friends and Family:   . Attends Religious Services:   . Active Member of Clubs or Organizations:   . Attends Banker Meetings:   Marland Kitchen Marital Status:   Intimate Partner Violence:   . Fear of Current or Ex-Partner:   . Emotionally Abused:   Marland Kitchen Physically Abused:   . Sexually Abused:     Environmental History: The patient lives in a 25 year old house with carpeting in the bedroom and central air/heat.  There is no known mold/water damage in the home.  There are no pets in the home.  The patient is a never smoker.  Current Outpatient Medications  Medication Sig Dispense Refill  . diphenhydrAMINE (BENADRYL) 25 MG tablet Take 25  mg by mouth at bedtime as needed for allergies.    Marland Kitchen ibuprofen (ADVIL,MOTRIN) 800 MG tablet Take 1 tablet (800 mg total) by mouth 3 (three) times daily. 21 tablet 0  . NASAL SALINE NA Place into the nose. Saline nasal wash daily    . cetirizine (ZYRTEC) 10 MG tablet Take 10 mg by mouth daily as needed for allergies.    . fexofenadine (ALLEGRA) 180 MG tablet Take 180 mg by mouth daily as needed for allergies or rhinitis.    . Fluticasone Propionate (XHANCE) 93 MCG/ACT EXHU Place 2 sprays into the nose 2 (two) times daily as needed. 32 mL 5  . levocetirizine (XYZAL) 5 MG tablet Take 1 tablet (5 mg total) by mouth daily as needed for allergies. 30 tablet 5  . loratadine (CLARITIN) 10 MG tablet Take 10 mg by mouth daily.    . Olopatadine HCl (PATADAY) 0.2 % SOLN One drop each eye once a  day as needed for itchy eyes. 2.5 mL 5   No current facility-administered medications for this visit.    Known medication allergies: No Known Allergies  I appreciate the opportunity to take part in [place] care. Please do not hesitate to contact me with questions.  Sincerely,   R. Edgar Frisk, MD

## 2020-02-03 NOTE — Patient Instructions (Addendum)
Seasonal allergic rhinitis  Aeroallergen avoidance measures have been discussed and provided in written form.  A prescription has been provided for levocetirizine(Xyzal), 5 mg daily as needed.  A prescription has been provided for [facility], 2 actuations per nostril twice a day. Proper technique has been discussed and demonstrated.  Nasal saline lavage (NeilMed) has been recommended as needed and prior to medicated nasal sprays along with instructions for proper administration.  If allergen avoidance measures and medications fail to adequately relieve symptoms, subcutaneous aeroallergen immunotherapy or sublingual immunotherapy (Grastek) will be considered.  Allergic conjunctivitis  Treatment plan as outlined above for allergic rhinitis.  A prescription has been provided for generic Pataday, one drop per eye daily as needed.  I have also recommended eye lubricant drops (i.e., Natural Tears) as needed.  Eustachian tube dysfunction  Treatment plan as outlined above for allergic rhinitis.  For thick post nasal drainage, ear pressure, nasal congestion, and/or sinus pressure, add guaifenesin 1200 mg (Mucinex Maximum Strength) plus/minus pseudoephedrine 120 mg  twice daily as needed with adequate hydration as discussed. Pseudoephedrine is only to be used for short-term relief of nasal/sinus congestion. Long-term use is discouraged due to potential side effects.  If this problem persists or progresses, further evaluation by otolaryngology may be warranted.   Return in about 3 months (around 05/04/2020), or if symptoms worsen or fail to improve.  Reducing Pollen Exposure  The American Academy of Allergy, Asthma and Immunology suggests the following steps to reduce your exposure to pollen during allergy seasons.    1. Do not hang sheets or clothing out to dry; pollen may collect on these items. 2. Do not mow lawns or spend time around freshly cut grass; mowing stirs up pollen. 3. Keep windows  closed at night.  Keep car windows closed while driving. 4. Minimize morning activities outdoors, a time when pollen counts are usually at their highest. 5. Stay indoors as much as possible when pollen counts or humidity is high and on windy days when pollen tends to remain in the air longer. 6. Use air conditioning when possible.  Many air conditioners have filters that trap the pollen spores. 7. Use a HEPA room air filter to remove pollen form the indoor air you breathe.

## 2020-02-03 NOTE — Assessment & Plan Note (Signed)
   Treatment plan as outlined above for allergic rhinitis.  For thick post nasal drainage, ear pressure, nasal congestion, and/or sinus pressure, add guaifenesin 1200 mg (Mucinex Maximum Strength) plus/minus pseudoephedrine 120 mg  twice daily as needed with adequate hydration as discussed. Pseudoephedrine is only to be used for short-term relief of nasal/sinus congestion. Long-term use is discouraged due to potential side effects.  If this problem persists or progresses, further evaluation by otolaryngology may be warranted.

## 2020-02-15 ENCOUNTER — Telehealth: Payer: Self-pay

## 2020-02-15 NOTE — Telephone Encounter (Signed)
Prior authorization for [facility] submitted on covermymeds. This has been approved and sent to pharmacy. Approval details:CaseId:[id number];Status:Approved;Review Type:Prior Auth;Coverage Start Date:01/16/2020;Coverage End Date:02/14/2021;

## 2020-05-05 ENCOUNTER — Ambulatory Visit: Payer: Commercial Managed Care - PPO | Admitting: Allergy and Immunology

## 2022-02-19 ENCOUNTER — Encounter: Payer: 59 | Attending: Internal Medicine | Admitting: Dietician

## 2022-02-19 ENCOUNTER — Encounter: Payer: Self-pay | Admitting: Dietician

## 2022-02-19 DIAGNOSIS — Z713 Dietary counseling and surveillance: Secondary | ICD-10-CM | POA: Diagnosis not present

## 2022-02-19 DIAGNOSIS — E109 Type 1 diabetes mellitus without complications: Secondary | ICD-10-CM | POA: Diagnosis present

## 2022-02-19 NOTE — Progress Notes (Signed)
? ?Diabetes Self-Management Education ? ?Visit Type: First/Initial ? ?Appt. Start Time: 1445 Appt. End Time: 1635 ? ?02/19/2022 ? ?[name], identified by name and date of birth, is a 26 y.o. male with a diagnosis of Diabetes: Type 1.  ? ?ASSESSMENT ?Patient is here today with his wife. ?He was diagnosed with Type 1 Diabetes 01/2021 ?[place] and was misdiagnosed with Type 2 Diabetes and he stopped taking insulin for 3 weeks. ? ?History includes Type 1 Diabetes (01/2021) ?Labs noted to include:  A1C 6.7% 01/09/2022 increased from 5.9% 07/2021, GAD 74.1, C-Peptide 1.7 04/2021 ?CGM:  Libre 3 - Average glucose 172 over the past 7 days.  Time in Range 72% (70-180), 19% (181-250), 8% (>250),1% (54-69) and correlated A1C of 7.2% over the past 7 days. ?Medications include Glargine 15 units q HS ? ?Weight hx: ?245 lbs 02/19/2022 ?200 lbs 01/2021 ?270 lbs 01/2021 ? ?Body Composition Scale Date ?5/15/ ?2023  ?Current Body Weight 245.6 lbs  ?Total Body Fat % 30.9  ?Visceral Fat 17  ?Fat-Free Mass % 69  ? Total Body Water % 50  ?Muscle-Mass lbs 46.2  ?BMI 35.2  ?Body Fat Displacement   ?       Torso  lbs 47.1  ?       Left Leg  lbs 9.4  ?       Right Leg  lbs 9.4  ?       Left Arm  lbs 4.7  ?       Right Arm   lbs 4.7  ? ?Patient lives with his wife.  His wife does the shopping and cooking.  He works in [employer].   ?Works partially remote. ? ?Height 5\' 10"  (1.778 m), weight 245 lb (111.1 kg). ?Body mass index is 35.15 kg/m?. ? ? Diabetes Self-Management Education - 02/19/22 1536   ? ?  ? Visit Information  ? Visit Type First/Initial   ?  ? Initial Visit  ? Diabetes Type Type 1   ? Date Diagnosed 01/2021   ? Are you currently following a meal plan? Yes   ? What type of meal plan do you follow? low carbohydrate   ? Are you taking your medications as prescribed? Yes   ?  ? Health Coping  ? How would you rate your overall health? Fair   ?  ? Psychosocial Assessment  ? Patient Belief/Attitude about Diabetes Motivated to  manage diabetes   ? What is the hardest part about your diabetes right now, causing you the most concern, or is the most worrisome to you about your diabetes?   Taking/obtaining medications   ? Self-care barriers None   ? Self-management support Doctor's office;Family   ? Other persons present Patient;Family Member   ? Patient Concerns Nutrition/Meal planning;Healthy Lifestyle;Weight Control   ? Special Needs None   ? Preferred Learning Style No preference indicated   ? Learning Readiness Ready   ? How often do you need to have someone help you when you read instructions, pamphlets, or other written materials from your doctor or pharmacy? 1 - Never   ? What is the last grade level you completed in school? 4 years college   ?  ? Pre-Education Assessment  ? Patient understands the diabetes disease and treatment process. Needs Instruction   ? Patient understands incorporating nutritional management into lifestyle. Needs Instruction   ? Patient undertands incorporating physical activity into lifestyle. Needs Instruction   ? Patient understands using medications  safely. Needs Instruction   ? Patient understands monitoring blood glucose, interpreting and using results Needs Instruction   ? Patient understands prevention, detection, and treatment of acute complications. Needs Instruction   ? Patient understands prevention, detection, and treatment of chronic complications. Needs Instruction   ? Patient understands how to develop strategies to address psychosocial issues. Needs Instruction   ? Patient understands how to develop strategies to promote health/change behavior. Needs Instruction   ?  ? Complications  ? Last HgB A1C per patient/outside source 6.7 %   01/09/2022 increased from 5.9% 07/2021  ? How often do you check your blood sugar? > 4 times/day   ? Fasting Blood glucose range (mg/dL) 54-627   ? Postprandial Blood glucose range (mg/dL) >035;009-381;829-937   200-300 when he eats carbohydrates  ? Number of  hypoglycemic episodes per month 2   ? Can you tell when your blood sugar is low? Yes   only because of CGM  ? What do you do if your blood sugar is low? drink juice   ? Number of hyperglycemic episodes ( >200mg /dL): Daily   ? Can you tell when your blood sugar is high? No   ? Have you had a dilated eye exam in the past 12 months? No   ? Have you had a dental exam in the past 12 months? Yes   ? Are you checking your feet? Yes   ? How many days per week are you checking your feet? 3   ?  ? Dietary Intake  ? Breakfast omelet (3 eggs), [place] bacon or [place] hot dogs OR [place] wrap on low carb tortilla, avocado, cheese, tomatoes   10  ? Snack (afternoon) cheese stick, fruit OR low carb tortilla pizza OR protein shake made with unsweetened almond milk   ? Dinner cauliflower rice, chicken, vegetables OR taco salad with ground beef, corn, black beans OR chicken on a salad OR taco salad   ? Snack (evening) occasional popcorn   ?  ? Activity / Exercise  ? Activity / Exercise Type Moderate (swimming / aerobic walking)   ? How many days per week do you exercise? 5   ? How many minutes per day do you exercise? 60   ? Total minutes per week of exercise 300   ?  ? Patient Education  ? Previous Diabetes Education No   except at MD office  ? Disease Pathophysiology Definition of diabetes, type 1 and 2, and the diagnosis of diabetes;Explored patient's options for treatment of their diabetes;Factors that contribute to the development of diabetes   ? Healthy Eating Role of diet in the treatment of diabetes and the relationship between the three main macronutrients and blood glucose level;Food label reading, portion sizes and measuring food.;Plate Method;Carbohydrate counting;Meal options for control of blood glucose level and chronic complications.;Reviewed blood glucose goals for pre and post meals and how to evaluate the patients' food intake on their blood glucose level.   ? Being Active Role of exercise on diabetes management,  blood pressure control and cardiac health.;Identified with patient nutritional and/or medication changes necessary with exercise.   ? Medications Reviewed patients medication for diabetes, action, purpose, timing of dose and side effects.;Taught/reviewed insulin/injectables, injection, site rotation, insulin/injectables storage and needle disposal.   ? Acute complications Taught prevention, symptoms, and  treatment of hypoglycemia - the 15 rule.;Discussed and identified patients' prevention, symptoms, and treatment of hyperglycemia.   ? Chronic complications Relationship between chronic complications and blood glucose control;Retinopathy and  reason for yearly dilated eye exams;Assessed and discussed foot care and prevention of foot problems   ? Diabetes Stress and Support Worked with patient to identify barriers to care and solutions;Identified and addressed patients feelings and concerns about diabetes;Role of stress on diabetes   ?  ? Individualized Goals (developed by patient)  ? Nutrition General guidelines for healthy choices and portions discussed   ? Physical Activity Exercise 5-7 days per week;60 minutes per day   ? Medications take my medication as prescribed   ? Monitoring  Consistenly use CGM   ? Problem Solving Eating Pattern   ? Reducing Risk treat hypoglycemia with 15 grams of carbs if blood glucose less than 70mg /dL;examine blood glucose patterns;do foot checks daily   ? Health Coping Other (comment)   added patient to type 1 support group email list  ?  ? Post-Education Assessment  ? Patient understands the diabetes disease and treatment process. Demonstrates understanding / competency   ? Patient understands incorporating nutritional management into lifestyle. Demonstrates understanding / competency   ? Patient undertands incorporating physical activity into lifestyle. Demonstrates understanding / competency   ? Patient understands using medications safely. Demonstrates understanding / competency    ? Patient understands monitoring blood glucose, interpreting and using results Demonstrates understanding / competency   ? Patient understands prevention, detection, and treatment of acute complications. Demonst

## 2022-02-19 NOTE — Patient Instructions (Addendum)
You are doing well with your insulin injections.  Recommend that after giving the injection, count to 6 slowly then remove the needle. ? ?Eye exam ? ?Continue to monitor your blood sugar trends and compare glucose before meals to after meals.  How much did it spike, what was the cause? ?Discuss continued high post meal glucose with MD. ? ?Continue to eat a low fat diet which is rich in vegetables, fruit, whole grains and small portions meat. ?Choose a small amount of protein with snacks to slow glucose response ? ? ?

## 2022-03-08 ENCOUNTER — Ambulatory Visit: Payer: BLUE CROSS/BLUE SHIELD | Admitting: Dietician
